# Patient Record
Sex: Female | Born: 1945 | Race: White | Hispanic: No | Smoking: Never smoker
Health system: Southern US, Community
[De-identification: ages and names within clinical notes are randomized; demographics above are authoritative.]

## PROBLEM LIST (undated history)

## (undated) DIAGNOSIS — D039 Melanoma in situ, unspecified: Secondary | ICD-10-CM

## (undated) DIAGNOSIS — D229 Melanocytic nevi, unspecified: Secondary | ICD-10-CM

## (undated) HISTORY — DX: Melanoma in situ, unspecified: D03.9

## (undated) HISTORY — DX: Melanocytic nevi, unspecified: D22.9

---
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
## 1993-05-20 HISTORY — PX: ABDOMINAL HYSTERECTOMY: SHX81

## 1999-03-21 ENCOUNTER — Other Ambulatory Visit: Admission: RE | Admit: 1999-03-21 | Discharge: 1999-03-21 | Payer: Self-pay | Admitting: *Deleted

## 2000-04-07 ENCOUNTER — Other Ambulatory Visit: Admission: RE | Admit: 2000-04-07 | Discharge: 2000-04-07 | Payer: Self-pay | Admitting: *Deleted

## 2001-04-07 ENCOUNTER — Other Ambulatory Visit: Admission: RE | Admit: 2001-04-07 | Discharge: 2001-04-07 | Payer: Self-pay | Admitting: *Deleted

## 2002-04-12 DIAGNOSIS — D039 Melanoma in situ, unspecified: Secondary | ICD-10-CM

## 2002-04-12 HISTORY — DX: Melanoma in situ, unspecified: D03.9

## 2004-10-12 ENCOUNTER — Ambulatory Visit (HOSPITAL_COMMUNITY): Admission: RE | Admit: 2004-10-12 | Discharge: 2004-10-12 | Payer: Self-pay | Admitting: Obstetrics and Gynecology

## 2005-04-22 DIAGNOSIS — D229 Melanocytic nevi, unspecified: Secondary | ICD-10-CM

## 2005-04-22 HISTORY — DX: Melanocytic nevi, unspecified: D22.9

## 2005-05-20 HISTORY — PX: COLONOSCOPY: SHX174

## 2005-12-26 ENCOUNTER — Ambulatory Visit (HOSPITAL_COMMUNITY): Admission: RE | Admit: 2005-12-26 | Discharge: 2005-12-26 | Payer: Self-pay | Admitting: Family Medicine

## 2007-01-14 ENCOUNTER — Ambulatory Visit (HOSPITAL_COMMUNITY): Admission: RE | Admit: 2007-01-14 | Discharge: 2007-01-14 | Payer: Self-pay | Admitting: Family Medicine

## 2008-02-10 ENCOUNTER — Ambulatory Visit (HOSPITAL_COMMUNITY): Admission: RE | Admit: 2008-02-10 | Discharge: 2008-02-10 | Payer: Self-pay | Admitting: Family Medicine

## 2009-02-14 ENCOUNTER — Ambulatory Visit (HOSPITAL_COMMUNITY): Admission: RE | Admit: 2009-02-14 | Discharge: 2009-02-14 | Payer: Self-pay | Admitting: Family Medicine

## 2010-03-30 ENCOUNTER — Ambulatory Visit (HOSPITAL_COMMUNITY): Admission: RE | Admit: 2010-03-30 | Discharge: 2010-03-30 | Payer: Self-pay | Admitting: Family Medicine

## 2011-03-13 ENCOUNTER — Other Ambulatory Visit (HOSPITAL_COMMUNITY): Payer: Self-pay | Admitting: Family Medicine

## 2011-03-13 DIAGNOSIS — Z1231 Encounter for screening mammogram for malignant neoplasm of breast: Secondary | ICD-10-CM

## 2011-04-02 ENCOUNTER — Ambulatory Visit (HOSPITAL_COMMUNITY)
Admission: RE | Admit: 2011-04-02 | Discharge: 2011-04-02 | Disposition: A | Discharge status: Home / Self Care | Payer: Medicare Other | Source: Ambulatory Visit | Attending: Family Medicine | Admitting: Family Medicine

## 2011-04-02 DIAGNOSIS — Z1231 Encounter for screening mammogram for malignant neoplasm of breast: Secondary | ICD-10-CM | POA: Insufficient documentation

## 2012-02-28 ENCOUNTER — Other Ambulatory Visit (HOSPITAL_COMMUNITY): Payer: Self-pay | Admitting: Family Medicine

## 2012-02-28 DIAGNOSIS — Z1231 Encounter for screening mammogram for malignant neoplasm of breast: Secondary | ICD-10-CM

## 2012-04-06 ENCOUNTER — Ambulatory Visit (HOSPITAL_COMMUNITY)
Admission: RE | Admit: 2012-04-06 | Discharge: 2012-04-06 | Disposition: A | Discharge status: Home / Self Care | Payer: Medicare Other | Source: Ambulatory Visit | Attending: Family Medicine | Admitting: Family Medicine

## 2012-04-06 DIAGNOSIS — Z1231 Encounter for screening mammogram for malignant neoplasm of breast: Secondary | ICD-10-CM | POA: Insufficient documentation

## 2013-03-18 ENCOUNTER — Other Ambulatory Visit (HOSPITAL_COMMUNITY): Payer: Self-pay | Admitting: Family Medicine

## 2013-03-18 DIAGNOSIS — Z1231 Encounter for screening mammogram for malignant neoplasm of breast: Secondary | ICD-10-CM

## 2013-04-08 ENCOUNTER — Ambulatory Visit (HOSPITAL_COMMUNITY)
Admission: RE | Admit: 2013-04-08 | Discharge: 2013-04-08 | Disposition: A | Discharge status: Home / Self Care | Payer: Medicare Other | Source: Ambulatory Visit | Attending: Family Medicine | Admitting: Family Medicine

## 2013-04-08 DIAGNOSIS — Z1231 Encounter for screening mammogram for malignant neoplasm of breast: Secondary | ICD-10-CM | POA: Insufficient documentation

## 2013-11-16 ENCOUNTER — Other Ambulatory Visit: Payer: Self-pay | Admitting: Orthopedic Surgery

## 2013-11-16 ENCOUNTER — Encounter (HOSPITAL_BASED_OUTPATIENT_CLINIC_OR_DEPARTMENT_OTHER): Payer: Self-pay | Admitting: Cardiology

## 2013-11-16 NOTE — Progress Notes (Signed)
No labs needed

## 2013-11-22 NOTE — H&P (Signed)
  [name] [name] is an 68 y.o. female.   Chief Complaint: c/o chronic and progressive STS right thumb HPI: [name] [name] presented for evaluation of stiffness of her right thumb IP joint and stenosing tenosynovitis with tenderness over the A-1 pulley. 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 Her symptoms have been present since the first week of December. She has noted swelling of her IP joint after walking her dog.    History reviewed. No pertinent past medical history.  Past Surgical History  Procedure Laterality Date  . Appendectomy  1954  . Abdominal hysterectomy  1995  . Wisdom tooth extraction  1964  . Colonoscopy  2007    History reviewed. No pertinent family history. Social History:  reports that she has never smoked. She does not have any smokeless tobacco history on file. She reports that she drinks alcohol. She reports that she does not use illicit drugs.  Allergies: No Known Allergies  No prescriptions prior to admission    No results found for this or any previous visit (from the past 48 hour(s)).  No results found.   Pertinent items are noted in HPI.  Height 5' 3.5" (1.613 m), weight 62.143 kg (137 lb).  General appearance: alert Head: Normocephalic, without obvious abnormality Neck: supple, symmetrical, trachea midline Resp: clear to auscultation bilaterally Cardio: regular rate and rhythm GI: normal findings: bowel sounds normal Extremities:  Inspection of her hand reveals swelling of her right thumb IP joint with loss of full ROM. She triggers over the A-1 pulley. She cannot fully extend the IP joint when compared to the contralateral side. She has full ROM of her fingers. Her motor and sensory exam is intact. Her pulses and capillary refill is intact.  Four views of the right thumb demonstrate age appropriate changes but no significant pathology.   Pulses: 2+ and symmetric Skin: normal Neurologic: Grossly normal    Assessment/Plan Impression: Chronic  STS right thumb  Plan: To the OR for release A-1 pulley right thumb.The procedure, risks,benefits and post-op course were discussed with the patient at length and they were in agreement with the plan.  DASNOIT,[name] [name] 11/22/2013, 3:47 PM   H&P documentation: 11/23/2013  -History and Physical Reviewed  -Patient has been re-examined  -No change in the plan of care  [name] Sickle, MD

## 2013-11-23 ENCOUNTER — Encounter (HOSPITAL_BASED_OUTPATIENT_CLINIC_OR_DEPARTMENT_OTHER)
Admission: RE | Disposition: A | Discharge status: Home / Self Care | Payer: Self-pay | Source: Ambulatory Visit | Attending: Orthopedic Surgery

## 2013-11-23 ENCOUNTER — Ambulatory Visit (HOSPITAL_BASED_OUTPATIENT_CLINIC_OR_DEPARTMENT_OTHER): Payer: Medicare Other | Admitting: Anesthesiology

## 2013-11-23 ENCOUNTER — Ambulatory Visit (HOSPITAL_BASED_OUTPATIENT_CLINIC_OR_DEPARTMENT_OTHER)
Admission: RE | Admit: 2013-11-23 | Discharge: 2013-11-23 | Disposition: A | Discharge status: Home / Self Care | Payer: Medicare Other | Source: Ambulatory Visit | Attending: Orthopedic Surgery | Admitting: Orthopedic Surgery

## 2013-11-23 ENCOUNTER — Encounter (HOSPITAL_BASED_OUTPATIENT_CLINIC_OR_DEPARTMENT_OTHER): Payer: Self-pay | Admitting: Anesthesiology

## 2013-11-23 ENCOUNTER — Encounter (HOSPITAL_BASED_OUTPATIENT_CLINIC_OR_DEPARTMENT_OTHER): Payer: Medicare Other | Admitting: Anesthesiology

## 2013-11-23 DIAGNOSIS — M6530 Trigger finger, unspecified finger: Secondary | ICD-10-CM | POA: Insufficient documentation

## 2013-11-23 DIAGNOSIS — M65839 Other synovitis and tenosynovitis, unspecified forearm: Secondary | ICD-10-CM | POA: Insufficient documentation

## 2013-11-23 DIAGNOSIS — M65849 Other synovitis and tenosynovitis, unspecified hand: Principal | ICD-10-CM

## 2013-11-23 HISTORY — PX: TRIGGER FINGER RELEASE: SHX641

## 2013-11-23 SURGERY — RELEASE, A1 PULLEY, FOR TRIGGER FINGER
Anesthesia: Monitor Anesthesia Care | Site: Thumb | Laterality: Right

## 2013-11-23 MED ORDER — PROPOFOL INFUSION 10 MG/ML OPTIME
INTRAVENOUS | Status: DC | PRN
  Administered 2013-11-23: 100 ug/kg/min via INTRAVENOUS

## 2013-11-23 MED ORDER — OXYCODONE HCL 5 MG PO TABS: 5.0000 mg | ORAL_TABLET | Freq: Once | ORAL | Status: DC | PRN

## 2013-11-23 MED ORDER — MIDAZOLAM HCL 5 MG/5ML IJ SOLN
INTRAMUSCULAR | Status: DC | PRN
  Administered 2013-11-23: 2 mg via INTRAVENOUS

## 2013-11-23 MED ORDER — LIDOCAINE HCL 2 % IJ SOLN
INTRAMUSCULAR | Status: DC | PRN
  Administered 2013-11-23: 2 mL

## 2013-11-23 MED ORDER — ACETAMINOPHEN-CODEINE #3 300-30 MG PO TABS: 1.0000 | ORAL_TABLET | ORAL | Status: DC | PRN

## 2013-11-23 MED ORDER — ONDANSETRON HCL 4 MG/2ML IJ SOLN: 4.0000 mg | Freq: Once | INTRAMUSCULAR | Status: DC | PRN

## 2013-11-23 MED ORDER — PROPOFOL 10 MG/ML IV BOLUS
INTRAVENOUS | Status: DC | PRN
  Administered 2013-11-23: 50 mg via INTRAVENOUS

## 2013-11-23 MED ORDER — FENTANYL CITRATE 0.05 MG/ML IJ SOLN: 50.0000 ug | INTRAMUSCULAR | Status: DC | PRN

## 2013-11-23 MED ORDER — MIDAZOLAM HCL 2 MG/2ML IJ SOLN: 1.0000 mg | INTRAMUSCULAR | Status: DC | PRN

## 2013-11-23 MED ORDER — OXYCODONE HCL 5 MG/5ML PO SOLN: 5.0000 mg | Freq: Once | ORAL | Status: DC | PRN

## 2013-11-23 MED ORDER — HYDROMORPHONE HCL PF 1 MG/ML IJ SOLN: 0.2500 mg | INTRAMUSCULAR | Status: DC | PRN

## 2013-11-23 MED ORDER — CHLORHEXIDINE GLUCONATE 4 % EX LIQD: 60.0000 mL | Freq: Once | CUTANEOUS | Status: DC

## 2013-11-23 MED ORDER — MIDAZOLAM HCL 2 MG/2ML IJ SOLN
INTRAMUSCULAR | Status: AC
  Filled 2013-11-23: qty 2

## 2013-11-23 MED ORDER — LACTATED RINGERS IV SOLN
INTRAVENOUS | Status: DC
  Administered 2013-11-23: 09:00:00 via INTRAVENOUS

## 2013-11-23 MED ORDER — FENTANYL CITRATE 0.05 MG/ML IJ SOLN
INTRAMUSCULAR | Status: AC
Start: 2013-11-23 — End: 2013-11-23
  Filled 2013-11-23: qty 2

## 2013-11-23 MED ORDER — LIDOCAINE HCL 2 % IJ SOLN
INTRAMUSCULAR | Status: AC
  Filled 2013-11-23: qty 20

## 2013-11-23 MED ORDER — FENTANYL CITRATE 0.05 MG/ML IJ SOLN
INTRAMUSCULAR | Status: DC | PRN
  Administered 2013-11-23: 100 ug via INTRAVENOUS

## 2013-11-23 SURGICAL SUPPLY — 37 items
BANDAGE ADH SHEER 1  50/CT (GAUZE/BANDAGES/DRESSINGS) IMPLANT
BANDAGE COBAN STERILE 2 (GAUZE/BANDAGES/DRESSINGS) ×2 IMPLANT
BLADE SURG 15 STRL LF DISP TIS (BLADE) ×1 IMPLANT
BLADE SURG 15 STRL SS (BLADE) ×2
BNDG CMPR 9X4 STRL LF SNTH (GAUZE/BANDAGES/DRESSINGS) ×1
BNDG ESMARK 4X9 LF (GAUZE/BANDAGES/DRESSINGS) ×2 IMPLANT
BRUSH SCRUB EZ PLAIN DRY (MISCELLANEOUS) ×2 IMPLANT
CORDS BIPOLAR (ELECTRODE) ×1 IMPLANT
COVER MAYO STAND STRL (DRAPES) ×2 IMPLANT
COVER TABLE BACK 60X90 (DRAPES) ×2 IMPLANT
CUFF TOURNIQUET SINGLE 18IN (TOURNIQUET CUFF) ×1 IMPLANT
DECANTER SPIKE VIAL GLASS SM (MISCELLANEOUS) ×1 IMPLANT
DRAPE EXTREMITY T 121X128X90 (DRAPE) ×2 IMPLANT
DRAPE SURG 17X23 STRL (DRAPES) ×2 IMPLANT
GAUZE SPONGE 4X4 12PLY STRL (GAUZE/BANDAGES/DRESSINGS) ×2 IMPLANT
GLOVE BIOGEL M STRL SZ7.5 (GLOVE) ×2 IMPLANT
GLOVE ORTHO TXT STRL SZ7.5 (GLOVE) ×2 IMPLANT
GOWN STRL REUS W/ TWL LRG LVL3 (GOWN DISPOSABLE) ×1 IMPLANT
GOWN STRL REUS W/ TWL XL LVL3 (GOWN DISPOSABLE) ×2 IMPLANT
GOWN STRL REUS W/TWL LRG LVL3 (GOWN DISPOSABLE) ×2
GOWN STRL REUS W/TWL XL LVL3 (GOWN DISPOSABLE) ×2
NEEDLE 27GAX1X1/2 (NEEDLE) ×2 IMPLANT
PACK BASIN DAY SURGERY FS (CUSTOM PROCEDURE TRAY) ×2 IMPLANT
PADDING CAST ABS 4INX4YD NS (CAST SUPPLIES) ×1
PADDING CAST ABS COTTON 4X4 ST (CAST SUPPLIES) ×1 IMPLANT
STOCKINETTE 4X48 STRL (DRAPES) ×2 IMPLANT
STRIP CLOSURE SKIN 1/2X4 (GAUZE/BANDAGES/DRESSINGS) ×1 IMPLANT
SUT ETHILON 5 0 P 3 18 (SUTURE)
SUT NYLON ETHILON 5-0 P-3 1X18 (SUTURE) IMPLANT
SUT PROLENE 3 0 PS 2 (SUTURE) IMPLANT
SUT PROLENE 4 0 P 3 18 (SUTURE) ×1 IMPLANT
SYR 3ML 23GX1 SAFETY (SYRINGE) IMPLANT
SYRINGE CONTROL L 12CC (SYRINGE) ×2 IMPLANT
SYRINGE CONTROL LL 12CC (SYRINGE) IMPLANT
TOWEL OR 17X24 6PK STRL BLUE (TOWEL DISPOSABLE) ×2 IMPLANT
TRAY DSU PREP LF (CUSTOM PROCEDURE TRAY) ×2 IMPLANT
UNDERPAD 30X30 INCONTINENT (UNDERPADS AND DIAPERS) ×2 IMPLANT

## 2013-11-23 NOTE — Brief Op Note (Signed)
11/23/2013  9:20 AM  PATIENT:  [name] [name]  68 y.o. female  PRE-OPERATIVE DIAGNOSIS:  CHRONIC STENOSING TENOSYNOVITIS RIGHT THUMB  POST-OPERATIVE DIAGNOSIS:  chronic stenosing tenosynovitis right thumb  PROCEDURE:  Procedure(s): RELEASE RIGHT THUMB A-1 PULLEY (Right)  SURGEON:  Surgeon(s) and Role:    * Cammie Sickle, MD - Primary  PHYSICIAN ASSISTANT:   ASSISTANTS: surgical tech  ANESTHESIA:   IV sedation  EBL:     BLOOD ADMINISTERED:none  DRAINS: none   LOCAL MEDICATIONS USED:  XYLOCAINE   SPECIMEN:  No Specimen  DISPOSITION OF SPECIMEN:  N/A  COUNTS:  YES  TOURNIQUET:   Total Tourniquet Time Documented: Upper Arm (Right) - 10 minutes Total: Upper Arm (Right) - 10 minutes   DICTATION: .Other Dictation: Dictation Number [id number]  PLAN OF CARE: Discharge to home after PACU  PATIENT DISPOSITION:  PACU - hemodynamically stable.   Delay start of Pharmacological VTE agent (>24hrs) due to surgical blood loss or risk of bleeding: not applicable

## 2013-11-23 NOTE — Anesthesia Postprocedure Evaluation (Signed)
  Anesthesia Post-op Note  Patient: [name] [name]  Procedure(s) Performed: Procedure(s): RELEASE RIGHT THUMB A-1 PULLEY (Right)  Patient Location: PACU  Anesthesia Type:MAC  Level of Consciousness: awake, alert  and oriented  Airway and Oxygen Therapy: Patient Spontanous Breathing  Post-op Pain: none  Post-op Assessment: Post-op Vital signs reviewed  Post-op Vital Signs: Reviewed  Last Vitals:  Filed Vitals:   11/23/13 0945  BP: 101/51  Pulse: 63  Temp:   Resp: 13    Complications: No apparent anesthesia complications

## 2013-11-23 NOTE — Transfer of Care (Signed)
Immediate Anesthesia Transfer of Care Note  Patient: [name] [name]  Procedure(s) Performed: Procedure(s): RELEASE RIGHT THUMB A-1 PULLEY (Right)  Patient Location: PACU  Anesthesia Type:MAC  Level of Consciousness: awake, alert  and oriented  Airway & Oxygen Therapy: Patient Spontanous Breathing  Post-op Assessment: Report given to PACU RN and Post -op Vital signs reviewed and stable  Post vital signs: Reviewed and stable  Complications: No apparent anesthesia complications

## 2013-11-23 NOTE — Op Note (Signed)
[id number] 

## 2013-11-23 NOTE — Discharge Instructions (Addendum)

## 2013-11-23 NOTE — Anesthesia Preprocedure Evaluation (Signed)
Anesthesia Evaluation  Patient identified by MRN, date of birth, ID band Patient awake    Reviewed: Allergy & Precautions, H&P , NPO status , Patient's Chart, lab work & pertinent test results  Airway Mallampati: I TM Distance: >3 FB Neck ROM: Full    Dental  (+) Teeth Intact, Dental Advisory Given   Pulmonary  breath sounds clear to auscultation        Cardiovascular Rhythm:Regular Rate:Normal     Neuro/Psych    GI/Hepatic   Endo/Other    Renal/GU      Musculoskeletal   Abdominal   Peds  Hematology   Anesthesia Other Findings   Reproductive/Obstetrics                           Anesthesia Physical Anesthesia Plan  ASA: I  Anesthesia Plan: MAC   Post-op Pain Management:    Induction: Intravenous  Airway Management Planned: Simple Face Mask  Additional Equipment:   Intra-op Plan:   Post-operative Plan:   Informed Consent: I have reviewed the patients History and Physical, chart, labs and discussed the procedure including the risks, benefits and alternatives for the proposed anesthesia with the patient or authorized representative who has indicated his/her understanding and acceptance.   Dental advisory given  Plan Discussed with: CRNA, Anesthesiologist and Surgeon  Anesthesia Plan Comments:         Anesthesia Quick Evaluation

## 2013-11-24 ENCOUNTER — Encounter (HOSPITAL_BASED_OUTPATIENT_CLINIC_OR_DEPARTMENT_OTHER): Payer: Self-pay | Admitting: Orthopedic Surgery

## 2013-11-24 NOTE — Addendum Note (Signed)
Addendum created 11/24/13 7672 by Tawni Millers, CRNA   Modules edited: Charges VN

## 2013-11-24 NOTE — Op Note (Signed)
NAME:  [name] [name]               ACCOUNT NO.:  [id number]  MEDICAL RECORD NO.:  [id number]  LOCATION:                                 FACILITY:  PHYSICIAN:  [name] Mighty. Yitzel Shasteen, M.D.      DATE OF BIRTH:  DATE OF PROCEDURE:  11/23/2013 DATE OF DISCHARGE:                              OPERATIVE REPORT   PREOPERATIVE DIAGNOSIS:  Chronic stenosing tenosynovitis of right thumb at A1 pulley.  POSTOPERATIVE DIAGNOSIS:  Chronic stenosing tenosynovitis of right thumb at A1 pulley.  OPERATIONS:  Release of right thumb A1 pulley.  OPERATING SURGEON:  [name] Mighty. Lillard Bailon, M.D.  ASSISTANT:  Surgical technician.  ANESTHESIA:  2% lidocaine field block and flexor sheath block, right thumb, supplemented by IV sedation.  SUPERVISING ANESTHESIOLOGIST:  Lorrene Reid, M.D.  INDICATIONS:  [name] [name] is a 68 year old woman referred through the courtesy of Dr. Kathyrn Lass for evaluation and management of a locking right trigger thumb.  She initially began treatment in February 2015, she responded initially to steroid injection, followed by period of therapeutic taping.  She developed a recurrent stenosing tenosynovitis with a locked right thumb IP joint.  We recommended proceeding with release of the A1 pulley under local anesthesia and sedation.  After informed consent, she is brought to the operating room at this time.  PROCEDURE:  [name] [name] is brought to room #2 of the [facility] and placed in supine position on the operating table.  In the holding area, her right thumb is marked as the proper surgical site per protocol with a marking pen.  Dr. Al Corpus provided detailed anesthesia informed consent.  Local anesthesia with sedation was recommended and accepted by [name] [name].  Preoperatively, she had been reminded of potential risks and benefits of surgery.  Questions were invited and answered in detail.  PROCEDURE IN DETAILS:  [name] [name] was brought to room #2 of the  [facility] and placed in supine position on the operating table. Following IV sedation under Dr. Al Corpus supervision, the right thumb and palm were prepped with Betadine followed by infiltration of 2 mL of 2% lidocaine into the path of the intended incision and flexor sheath of the right thumb.  The right hand and arm were then prepped with Betadine soap and solution and sterilely draped.  A pneumatic tourniquet was applied to the proximal right brachium.  Following exsanguination of the right arm with Esmarch bandage, arterial tourniquet inflated to 220 mmHg.  Procedure commenced with routine surgical time-out.  Thereafter, a short transverse incision was fashioned directly over the palpably thickened A1 pulley.  Subcutaneous tissues were carefully divided.  Tenotomy scissors were used to expose the A1 pulley, followed by careful retraction of the radial proper digital nerve.  Three Ragnell retractors were placed Mercedes style clearly visualizing the A1 pulley followed by release of the pulley with scalpel and scissors.  Closed tenotomy scissors were passed proximally and distally to be sure there were no other secondary sites of entrapment.  None were there.  The IP joint recovered full passive range of motion.  We noted a very significant swelling on the flexor pollicis longus tendon due to chronic entrapment at  the A1 pulley.  This should resolve over the next few weeks.  The wound was then repaired with intradermal 4-0 Prolene suture and Steri-Strips.  [name] [name] was placed in compressive dressing with Steri- Strips, sterile gauze, and Coban.  For aftercare, she is provided a prescription for Tylenol with codeine No.3 one tab p.o. q.4-6 hours p.r.n. pain, 10 tablets without refill. We have encouraged to use Tylenol and ibuprofen as a primary analgesic.     [name] [name], M.D.     RVS/MEDQ  D:  11/23/2013  T:  11/24/2013  Job:  [id number]  cc:   Kathyrn Lass,  M.D.

## 2014-04-21 ENCOUNTER — Other Ambulatory Visit (HOSPITAL_COMMUNITY): Payer: Self-pay | Admitting: Family Medicine

## 2014-04-21 DIAGNOSIS — Z1231 Encounter for screening mammogram for malignant neoplasm of breast: Secondary | ICD-10-CM

## 2014-04-29 ENCOUNTER — Ambulatory Visit (HOSPITAL_COMMUNITY)
Admission: RE | Admit: 2014-04-29 | Discharge: 2014-04-29 | Disposition: A | Discharge status: Home / Self Care | Payer: Medicare Other | Source: Ambulatory Visit | Attending: Family Medicine | Admitting: Family Medicine

## 2014-04-29 DIAGNOSIS — Z1231 Encounter for screening mammogram for malignant neoplasm of breast: Secondary | ICD-10-CM

## 2015-04-06 ENCOUNTER — Other Ambulatory Visit: Payer: Self-pay

## 2015-04-06 DIAGNOSIS — Z1231 Encounter for screening mammogram for malignant neoplasm of breast: Secondary | ICD-10-CM

## 2015-05-10 ENCOUNTER — Ambulatory Visit
Admission: RE | Admit: 2015-05-10 | Discharge: 2015-05-10 | Disposition: A | Discharge status: Home / Self Care | Payer: Medicare Other | Source: Ambulatory Visit

## 2015-05-10 DIAGNOSIS — Z1231 Encounter for screening mammogram for malignant neoplasm of breast: Secondary | ICD-10-CM

## 2016-04-17 ENCOUNTER — Other Ambulatory Visit: Payer: Self-pay | Admitting: Family Medicine

## 2016-04-17 DIAGNOSIS — Z1231 Encounter for screening mammogram for malignant neoplasm of breast: Secondary | ICD-10-CM

## 2016-05-22 ENCOUNTER — Ambulatory Visit
Admission: RE | Admit: 2016-05-22 | Discharge: 2016-05-22 | Disposition: A | Discharge status: Home / Self Care | Payer: Medicare Other | Source: Ambulatory Visit | Attending: Family Medicine | Admitting: Family Medicine

## 2016-05-22 DIAGNOSIS — Z1231 Encounter for screening mammogram for malignant neoplasm of breast: Secondary | ICD-10-CM

## 2018-06-30 ENCOUNTER — Other Ambulatory Visit: Payer: Self-pay | Admitting: Family Medicine

## 2018-06-30 DIAGNOSIS — E7800 Pure hypercholesterolemia, unspecified: Secondary | ICD-10-CM

## 2018-07-01 ENCOUNTER — Other Ambulatory Visit: Payer: Self-pay | Admitting: Family Medicine

## 2018-07-01 DIAGNOSIS — Z1231 Encounter for screening mammogram for malignant neoplasm of breast: Secondary | ICD-10-CM

## 2018-07-14 ENCOUNTER — Ambulatory Visit (HOSPITAL_COMMUNITY)
Admission: RE | Admit: 2018-07-14 | Discharge: 2018-07-14 | Disposition: A | Discharge status: Home / Self Care | Payer: Medicare Other | Source: Ambulatory Visit | Attending: Family Medicine | Admitting: Family Medicine

## 2018-07-14 DIAGNOSIS — R918 Other nonspecific abnormal finding of lung field: Secondary | ICD-10-CM | POA: Diagnosis not present

## 2018-07-14 DIAGNOSIS — I712 Thoracic aortic aneurysm, without rupture: Secondary | ICD-10-CM | POA: Diagnosis not present

## 2018-07-14 DIAGNOSIS — I313 Pericardial effusion (noninflammatory): Secondary | ICD-10-CM | POA: Insufficient documentation

## 2018-07-14 DIAGNOSIS — E7800 Pure hypercholesterolemia, unspecified: Secondary | ICD-10-CM | POA: Diagnosis not present

## 2018-07-27 ENCOUNTER — Ambulatory Visit
Admission: RE | Admit: 2018-07-27 | Discharge: 2018-07-27 | Disposition: A | Discharge status: Home / Self Care | Payer: Medicare Other | Source: Ambulatory Visit | Attending: Family Medicine | Admitting: Family Medicine

## 2018-07-27 DIAGNOSIS — Z1231 Encounter for screening mammogram for malignant neoplasm of breast: Secondary | ICD-10-CM

## 2018-08-13 ENCOUNTER — Encounter: Payer: Self-pay | Admitting: *Deleted

## 2019-06-08 ENCOUNTER — Ambulatory Visit: Payer: Medicare Other | Attending: Internal Medicine

## 2019-06-08 DIAGNOSIS — Z23 Encounter for immunization: Secondary | ICD-10-CM

## 2019-06-08 NOTE — Progress Notes (Signed)
   Covid-19 Vaccination Clinic  Name:  MICKELA HOLDERBAUM    MRN: [id number] DOB: May 18, 1946  06/08/2019  Ms. Folk was observed post Covid-19 immunization for 15 minutes without incidence. She was provided with Vaccine Information Sheet and instruction to access the V-Safe system.   Ms. Giffen was instructed to call 911 with any severe reactions post vaccine: Marland Kitchen Difficulty breathing  . Swelling of your face and throat  . A fast heartbeat  . A bad rash all over your body  . Dizziness and weakness    Immunizations Administered    Name Date Dose VIS Date Route   Pfizer COVID-19 Vaccine 06/08/2019 12:45 PM 0.3 mL 04/30/2019 Intramuscular   Manufacturer: [place]   Lot: [id number]   [place]: [id number]

## 2019-06-21 ENCOUNTER — Ambulatory Visit: Payer: Self-pay

## 2019-06-28 ENCOUNTER — Ambulatory Visit: Payer: Medicare Other | Attending: Internal Medicine

## 2019-06-28 DIAGNOSIS — Z23 Encounter for immunization: Secondary | ICD-10-CM | POA: Insufficient documentation

## 2019-06-28 NOTE — Progress Notes (Signed)
   Covid-19 Vaccination Clinic  Name:  [name] [name]    MRN: [id number] DOB: 30-Jun-1945  06/28/2019  [name] was observed post Covid-19 immunization for 15 minutes without incidence. She was provided with Vaccine Information Sheet and instruction to access the V-Safe system.   [name] was instructed to call 911 with any severe reactions post vaccine: Marland Kitchen Difficulty breathing  . Swelling of your face and throat  . A fast heartbeat  . A bad rash all over your body  . Dizziness and weakness    Immunizations Administered    Name Date Dose VIS Date Route   Pfizer COVID-19 Vaccine 06/28/2019  8:44 AM 0.3 mL 04/30/2019 Intramuscular   Manufacturer: [place]   Lot: [id number]   [place]: [id number]

## 2019-07-13 ENCOUNTER — Other Ambulatory Visit: Payer: Self-pay | Admitting: Family Medicine

## 2019-07-20 ENCOUNTER — Other Ambulatory Visit: Payer: Self-pay | Admitting: Family Medicine

## 2019-07-20 DIAGNOSIS — Z1231 Encounter for screening mammogram for malignant neoplasm of breast: Secondary | ICD-10-CM

## 2019-07-27 ENCOUNTER — Other Ambulatory Visit: Payer: Self-pay | Admitting: Family Medicine

## 2019-07-27 DIAGNOSIS — I7121 Aneurysm of the ascending aorta, without rupture: Secondary | ICD-10-CM

## 2019-07-27 DIAGNOSIS — I712 Thoracic aortic aneurysm, without rupture: Secondary | ICD-10-CM

## 2019-08-11 ENCOUNTER — Other Ambulatory Visit: Payer: Self-pay

## 2019-08-11 ENCOUNTER — Ambulatory Visit
Admission: RE | Admit: 2019-08-11 | Discharge: 2019-08-11 | Disposition: A | Discharge status: Home / Self Care | Payer: Medicare Other | Source: Ambulatory Visit | Attending: Family Medicine | Admitting: Family Medicine

## 2019-08-11 DIAGNOSIS — Z1231 Encounter for screening mammogram for malignant neoplasm of breast: Secondary | ICD-10-CM

## 2019-08-16 ENCOUNTER — Ambulatory Visit: Payer: Medicare Other | Attending: Internal Medicine

## 2019-08-16 ENCOUNTER — Ambulatory Visit
Admission: RE | Admit: 2019-08-16 | Discharge: 2019-08-16 | Disposition: A | Discharge status: Home / Self Care | Payer: Medicare Other | Source: Ambulatory Visit | Attending: Family Medicine | Admitting: Family Medicine

## 2019-08-16 DIAGNOSIS — I712 Thoracic aortic aneurysm, without rupture: Secondary | ICD-10-CM

## 2019-08-16 DIAGNOSIS — I7121 Aneurysm of the ascending aorta, without rupture: Secondary | ICD-10-CM

## 2019-08-16 DIAGNOSIS — Z20822 Contact with and (suspected) exposure to covid-19: Secondary | ICD-10-CM

## 2019-08-16 MED ORDER — GADOBENATE DIMEGLUMINE 529 MG/ML IV SOLN
12.0000 mL | Freq: Once | INTRAVENOUS | Status: AC | PRN
  Administered 2019-08-16: 14:00:00 12 mL via INTRAVENOUS

## 2019-08-17 LAB — SARS-COV-2, NAA 2 DAY TAT

## 2019-08-17 LAB — NOVEL CORONAVIRUS, NAA: SARS-CoV-2, NAA: NOT DETECTED

## 2019-08-27 ENCOUNTER — Other Ambulatory Visit: Payer: Self-pay | Admitting: Family Medicine

## 2019-08-27 DIAGNOSIS — R911 Solitary pulmonary nodule: Secondary | ICD-10-CM

## 2019-11-08 ENCOUNTER — Other Ambulatory Visit: Payer: Self-pay

## 2019-11-11 ENCOUNTER — Other Ambulatory Visit: Payer: Self-pay

## 2019-11-11 MED ORDER — VALGANCICLOVIR HCL 450 MG PO TABS: 450.0000 mg | ORAL_TABLET | Freq: Every day | ORAL | 1 refills | Status: AC

## 2019-11-12 ENCOUNTER — Telehealth: Payer: Self-pay | Admitting: *Deleted

## 2019-11-12 NOTE — Telephone Encounter (Signed)
Phone call to the [facility] to clarify the prescription for valacyclovir. The confirmed that it had been changed from valgancyclovior 450 to the original prescription valacyclovir 1 gram.

## 2020-04-21 ENCOUNTER — Ambulatory Visit
Admission: RE | Admit: 2020-04-21 | Discharge: 2020-04-21 | Disposition: A | Discharge status: Home / Self Care | Payer: Medicare Other | Source: Ambulatory Visit | Attending: Family Medicine | Admitting: Family Medicine

## 2020-04-21 ENCOUNTER — Other Ambulatory Visit: Payer: Self-pay

## 2020-04-21 DIAGNOSIS — R911 Solitary pulmonary nodule: Secondary | ICD-10-CM

## 2020-06-30 DIAGNOSIS — Z79899 Other long term (current) drug therapy: Secondary | ICD-10-CM | POA: Diagnosis not present

## 2020-06-30 DIAGNOSIS — E559 Vitamin D deficiency, unspecified: Secondary | ICD-10-CM | POA: Diagnosis not present

## 2020-07-13 DIAGNOSIS — Z0000 Encounter for general adult medical examination without abnormal findings: Secondary | ICD-10-CM | POA: Diagnosis not present

## 2020-07-13 DIAGNOSIS — H401190 Primary open-angle glaucoma, unspecified eye, stage unspecified: Secondary | ICD-10-CM | POA: Diagnosis not present

## 2020-07-13 DIAGNOSIS — M859 Disorder of bone density and structure, unspecified: Secondary | ICD-10-CM | POA: Diagnosis not present

## 2020-07-13 DIAGNOSIS — E559 Vitamin D deficiency, unspecified: Secondary | ICD-10-CM | POA: Diagnosis not present

## 2020-07-14 ENCOUNTER — Other Ambulatory Visit: Payer: Self-pay | Admitting: Family Medicine

## 2020-07-14 DIAGNOSIS — M8580 Other specified disorders of bone density and structure, unspecified site: Secondary | ICD-10-CM

## 2020-07-14 DIAGNOSIS — Z1231 Encounter for screening mammogram for malignant neoplasm of breast: Secondary | ICD-10-CM

## 2020-09-13 DIAGNOSIS — H401132 Primary open-angle glaucoma, bilateral, moderate stage: Secondary | ICD-10-CM | POA: Diagnosis not present

## 2020-09-14 ENCOUNTER — Ambulatory Visit: Payer: Medicare Other

## 2020-10-12 ENCOUNTER — Other Ambulatory Visit: Payer: Self-pay

## 2020-10-12 ENCOUNTER — Ambulatory Visit
Admission: RE | Admit: 2020-10-12 | Discharge: 2020-10-12 | Disposition: A | Discharge status: Home / Self Care | Payer: Medicare Other | Source: Ambulatory Visit | Attending: Family Medicine | Admitting: Family Medicine

## 2020-10-12 DIAGNOSIS — Z1231 Encounter for screening mammogram for malignant neoplasm of breast: Secondary | ICD-10-CM | POA: Diagnosis not present

## 2020-12-22 DIAGNOSIS — Z4802 Encounter for removal of sutures: Secondary | ICD-10-CM | POA: Diagnosis not present

## 2020-12-29 ENCOUNTER — Ambulatory Visit
Admission: RE | Admit: 2020-12-29 | Discharge: 2020-12-29 | Disposition: A | Discharge status: Home / Self Care | Payer: Medicare Other | Source: Ambulatory Visit | Attending: Family Medicine | Admitting: Family Medicine

## 2020-12-29 ENCOUNTER — Other Ambulatory Visit: Payer: Self-pay

## 2020-12-29 DIAGNOSIS — Z780 Asymptomatic menopausal state: Secondary | ICD-10-CM | POA: Diagnosis not present

## 2020-12-29 DIAGNOSIS — M8589 Other specified disorders of bone density and structure, multiple sites: Secondary | ICD-10-CM | POA: Diagnosis not present

## 2020-12-29 DIAGNOSIS — M8580 Other specified disorders of bone density and structure, unspecified site: Secondary | ICD-10-CM

## 2021-03-14 DIAGNOSIS — H524 Presbyopia: Secondary | ICD-10-CM | POA: Diagnosis not present

## 2021-03-14 DIAGNOSIS — H401132 Primary open-angle glaucoma, bilateral, moderate stage: Secondary | ICD-10-CM | POA: Diagnosis not present

## 2021-03-14 DIAGNOSIS — H04123 Dry eye syndrome of bilateral lacrimal glands: Secondary | ICD-10-CM | POA: Diagnosis not present

## 2021-03-14 DIAGNOSIS — H43813 Vitreous degeneration, bilateral: Secondary | ICD-10-CM | POA: Diagnosis not present

## 2021-07-10 ENCOUNTER — Other Ambulatory Visit: Payer: Self-pay

## 2021-07-10 ENCOUNTER — Ambulatory Visit: Payer: Medicare Other | Admitting: Dermatology

## 2021-07-10 DIAGNOSIS — D2339 Other benign neoplasm of skin of other parts of face: Secondary | ICD-10-CM | POA: Diagnosis not present

## 2021-07-10 DIAGNOSIS — Z1283 Encounter for screening for malignant neoplasm of skin: Secondary | ICD-10-CM | POA: Diagnosis not present

## 2021-07-10 DIAGNOSIS — L821 Other seborrheic keratosis: Secondary | ICD-10-CM

## 2021-07-10 DIAGNOSIS — D1801 Hemangioma of skin and subcutaneous tissue: Secondary | ICD-10-CM | POA: Diagnosis not present

## 2021-07-10 DIAGNOSIS — L720 Epidermal cyst: Secondary | ICD-10-CM

## 2021-07-10 DIAGNOSIS — B001 Herpesviral vesicular dermatitis: Secondary | ICD-10-CM

## 2021-07-10 DIAGNOSIS — D239 Other benign neoplasm of skin, unspecified: Secondary | ICD-10-CM

## 2021-07-10 MED ORDER — VALACYCLOVIR HCL 500 MG PO TABS: 500.0000 mg | ORAL_TABLET | Freq: Two times a day (BID) | ORAL | 1 refills | Status: AC

## 2021-07-12 ENCOUNTER — Encounter: Payer: Self-pay | Admitting: Dermatology

## 2021-07-12 NOTE — Progress Notes (Signed)
° °  New Patient   Subjective  [name] [name] is a 76 y.o. female who presents for the following: Annual Exam (Pt here for annual and a refill for VALGANCICLOVIR 450MG ).  General skin examination, several issues to discuss Location:  Duration:  Quality:  Associated Signs/Symptoms: Modifying Factors:  Severity:  Timing: Context:    The following portions of the chart were reviewed this encounter and updated as appropriate:  Tobacco   Allergies   Meds   Problems   Med Hx   Surg Hx   Fam Hx       Objective  Well appearing patient in no apparent distress; mood and affect are within normal limits. General skin examination, no atypical pigmented lesions (no recurrent),.  No nonmelanoma skin cancer.  Multiple 1 mm smooth red dermal papules  Multiple tan flattopped textured 4 to 8 mm papules, typical dermoscopy  Right Inner Elbow Dermatofibroma vs fibrosis: 4 mm firm pink dermal papule  Right Malar Cheek Half millimeter firm upper dermal papule  Left Upper Vermilion Lip History of recurrent fever blisters, discussed treatment options.    A full examination was performed including scalp, head, eyes, ears, nose, lips, neck, chest, axillae, abdomen, back, buttocks, bilateral upper extremities, bilateral lower extremities, hands, feet, fingers, toes, fingernails, and toenails. All findings within normal limits unless otherwise noted below.  Areas beneath undergarments not fully examined   Assessment & Plan      I, Lavonna Monarch, MD, have reviewed all documentation for this visit.  The documentation on 07/12/21 for the exam, diagnosis, procedures, and orders are all accurate and complete.

## 2021-07-17 DIAGNOSIS — Z79899 Other long term (current) drug therapy: Secondary | ICD-10-CM | POA: Diagnosis not present

## 2021-07-17 DIAGNOSIS — E559 Vitamin D deficiency, unspecified: Secondary | ICD-10-CM | POA: Diagnosis not present

## 2021-07-17 DIAGNOSIS — Z0000 Encounter for general adult medical examination without abnormal findings: Secondary | ICD-10-CM | POA: Diagnosis not present

## 2021-07-25 DIAGNOSIS — E559 Vitamin D deficiency, unspecified: Secondary | ICD-10-CM | POA: Diagnosis not present

## 2021-07-25 DIAGNOSIS — M859 Disorder of bone density and structure, unspecified: Secondary | ICD-10-CM | POA: Diagnosis not present

## 2021-09-11 DIAGNOSIS — H401132 Primary open-angle glaucoma, bilateral, moderate stage: Secondary | ICD-10-CM | POA: Diagnosis not present

## 2021-12-27 ENCOUNTER — Other Ambulatory Visit: Payer: Self-pay | Admitting: Family Medicine

## 2021-12-27 DIAGNOSIS — Z1231 Encounter for screening mammogram for malignant neoplasm of breast: Secondary | ICD-10-CM

## 2022-01-09 ENCOUNTER — Ambulatory Visit: Payer: Medicare Other

## 2022-01-24 ENCOUNTER — Ambulatory Visit
Admission: RE | Admit: 2022-01-24 | Discharge: 2022-01-24 | Disposition: A | Discharge status: Home / Self Care | Payer: Medicare Other | Source: Ambulatory Visit | Attending: Family Medicine | Admitting: Family Medicine

## 2022-01-24 DIAGNOSIS — Z1231 Encounter for screening mammogram for malignant neoplasm of breast: Secondary | ICD-10-CM | POA: Diagnosis not present

## 2022-03-13 DIAGNOSIS — H04123 Dry eye syndrome of bilateral lacrimal glands: Secondary | ICD-10-CM | POA: Diagnosis not present

## 2022-03-13 DIAGNOSIS — H524 Presbyopia: Secondary | ICD-10-CM | POA: Diagnosis not present

## 2022-03-13 DIAGNOSIS — H401132 Primary open-angle glaucoma, bilateral, moderate stage: Secondary | ICD-10-CM | POA: Diagnosis not present

## 2022-03-13 DIAGNOSIS — H43813 Vitreous degeneration, bilateral: Secondary | ICD-10-CM | POA: Diagnosis not present

## 2022-03-13 DIAGNOSIS — H52203 Unspecified astigmatism, bilateral: Secondary | ICD-10-CM | POA: Diagnosis not present

## 2022-07-10 ENCOUNTER — Ambulatory Visit: Payer: Medicare Other | Admitting: Dermatology

## 2022-07-19 DIAGNOSIS — Z0000 Encounter for general adult medical examination without abnormal findings: Secondary | ICD-10-CM | POA: Diagnosis not present

## 2022-07-31 DIAGNOSIS — Z23 Encounter for immunization: Secondary | ICD-10-CM | POA: Diagnosis not present

## 2022-07-31 DIAGNOSIS — E559 Vitamin D deficiency, unspecified: Secondary | ICD-10-CM | POA: Diagnosis not present

## 2022-07-31 DIAGNOSIS — E785 Hyperlipidemia, unspecified: Secondary | ICD-10-CM | POA: Diagnosis not present

## 2022-07-31 DIAGNOSIS — H401190 Primary open-angle glaucoma, unspecified eye, stage unspecified: Secondary | ICD-10-CM | POA: Diagnosis not present

## 2022-07-31 DIAGNOSIS — M810 Age-related osteoporosis without current pathological fracture: Secondary | ICD-10-CM | POA: Diagnosis not present

## 2022-09-11 DIAGNOSIS — H401132 Primary open-angle glaucoma, bilateral, moderate stage: Secondary | ICD-10-CM | POA: Diagnosis not present

## 2022-11-08 IMAGING — MG MM DIGITAL SCREENING BILAT W/ TOMO AND CAD
8 series · 8 of 24 positions shown · non-contrast
Comparison: Previous exam(s).

CLINICAL DATA: Screening.

EXAM:
DIGITAL SCREENING BILATERAL MAMMOGRAM WITH TOMOSYNTHESIS AND CAD
TECHNIQUE: Bilateral screening digital craniocaudal and mediolateral oblique
mammograms were obtained. Bilateral screening digital breast
tomosynthesis was performed. The images were evaluated with
computer-aided detection.

[R MLO synth-2D]
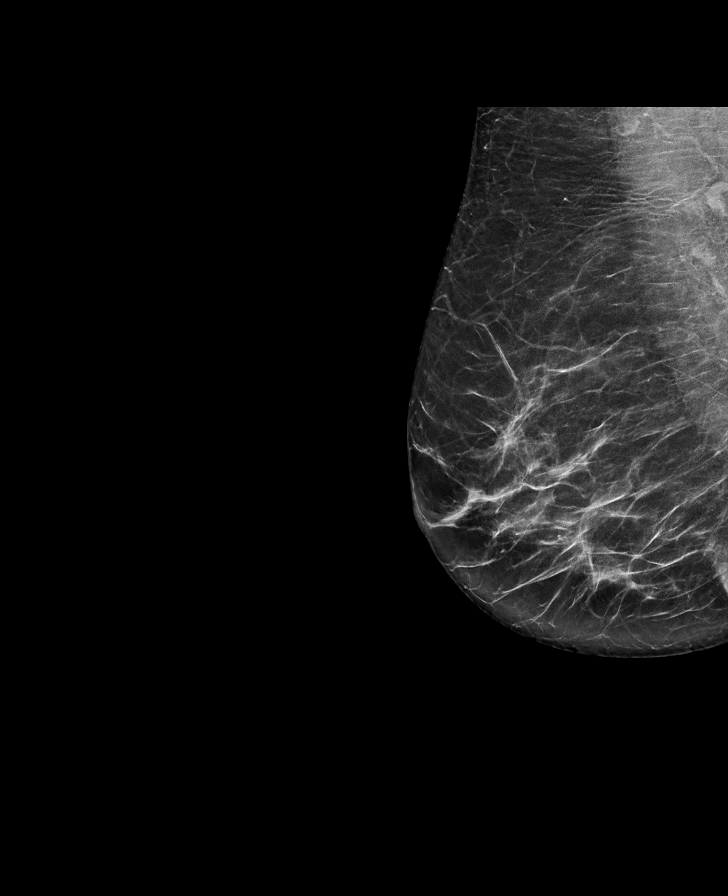

[L MLO synth-2D]
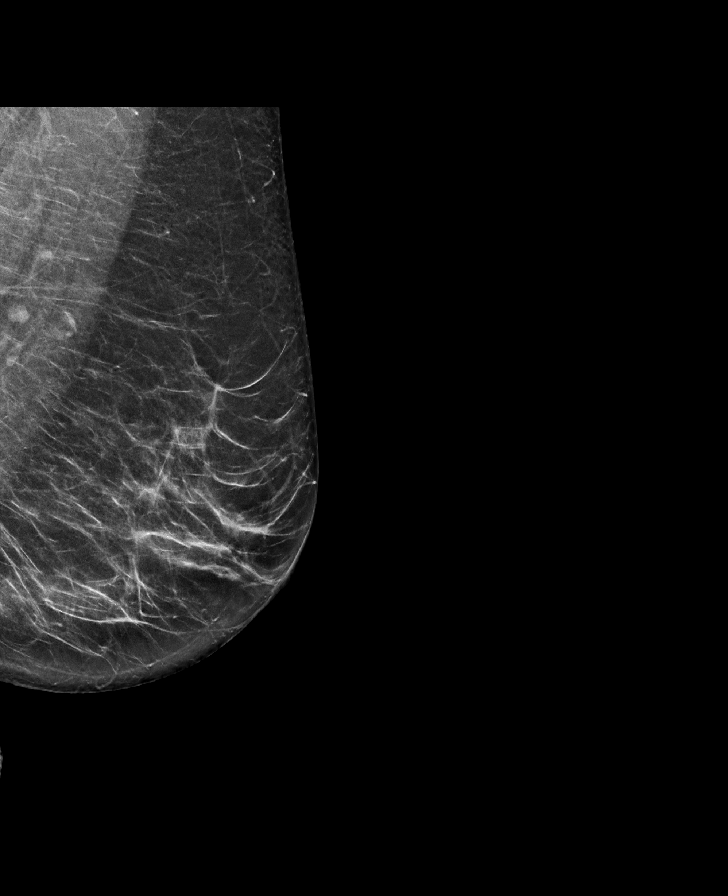

[L CC synth-2D]
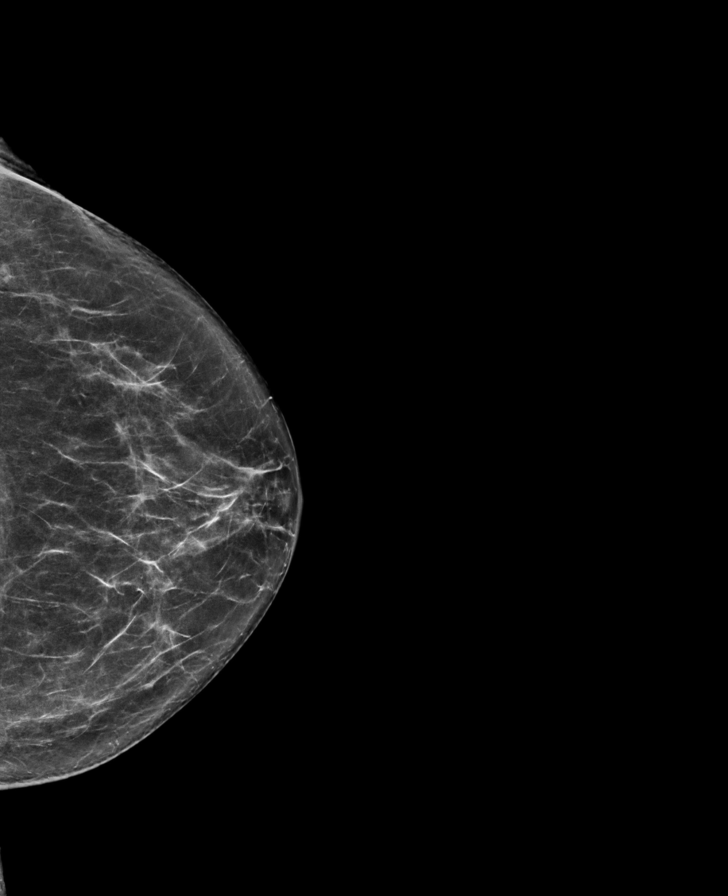

[R CC synth-2D]
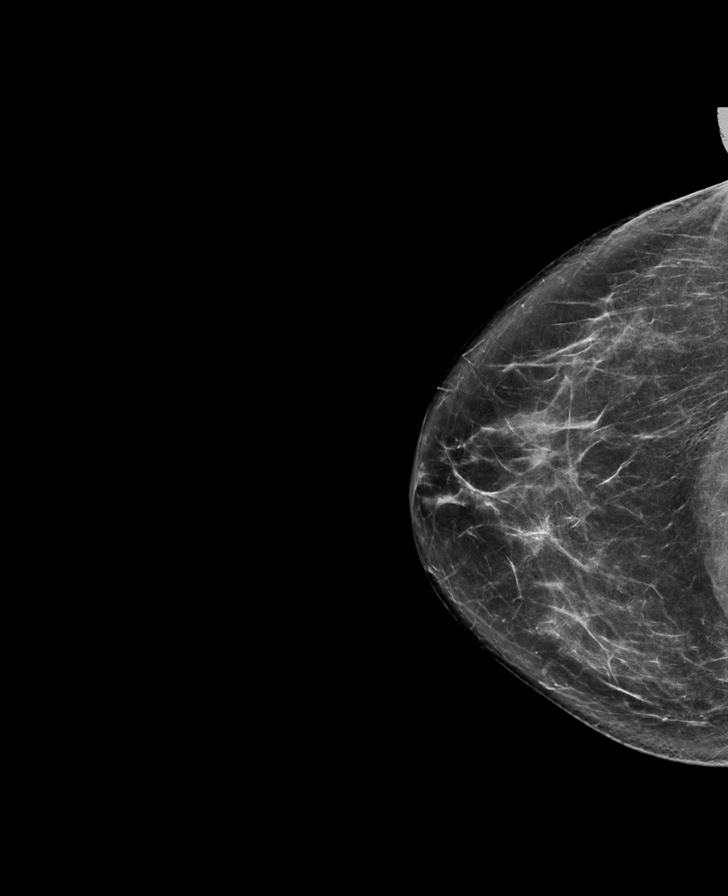

[R CC tomo · tomo slice 37/73.0]
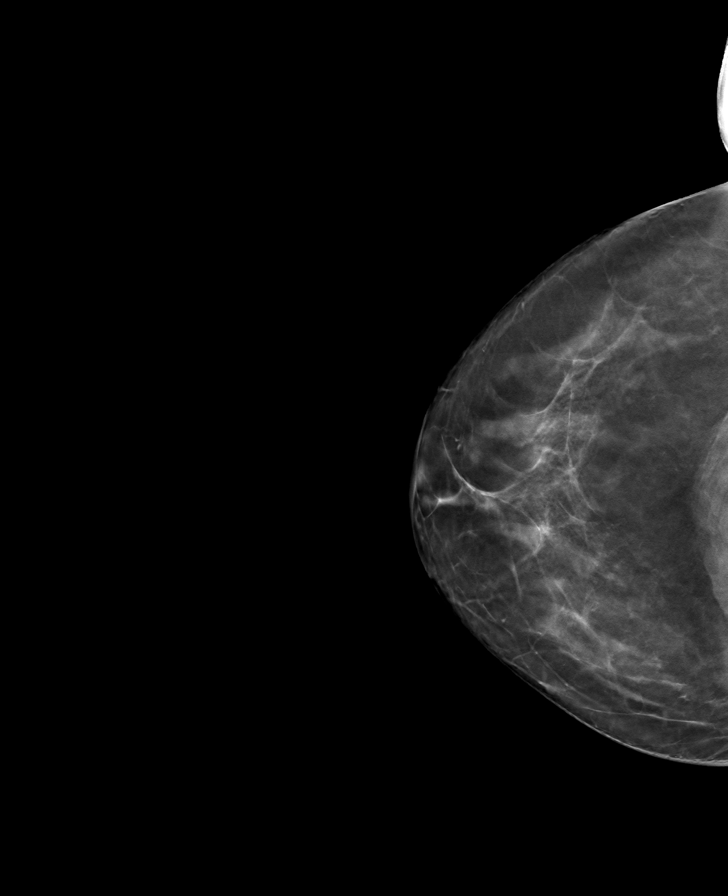

[L MLO tomo · tomo slice 36/71.0]
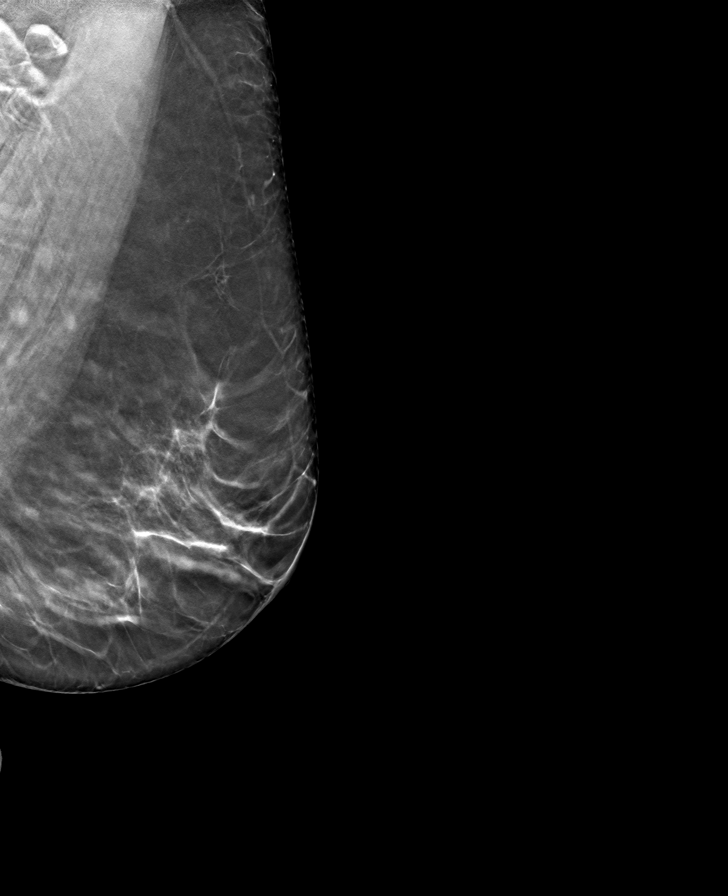

[R MLO tomo · tomo slice 37/74.0]
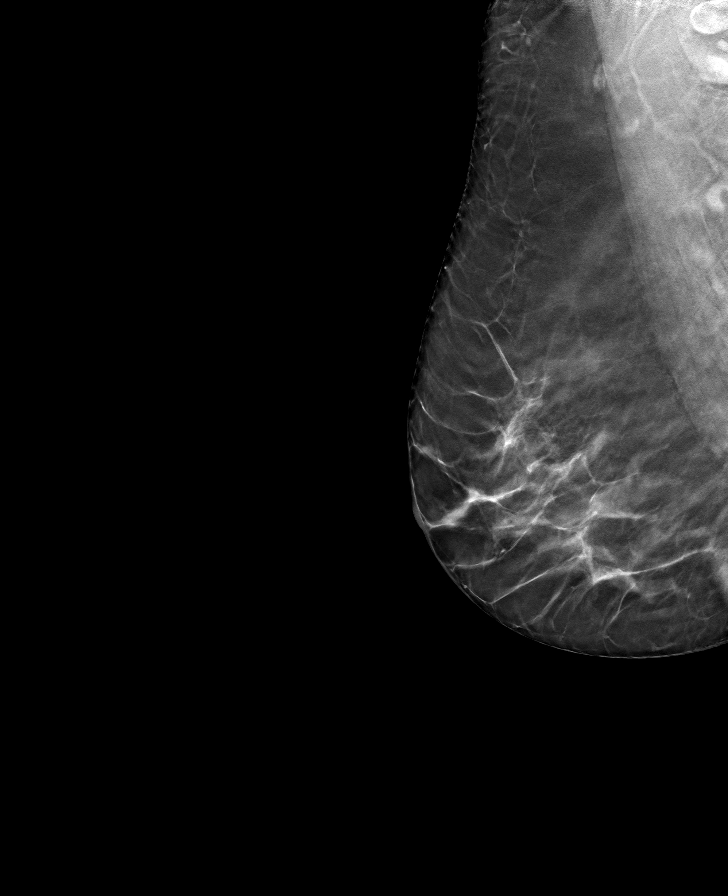

[L CC tomo · tomo slice 35/69.0]
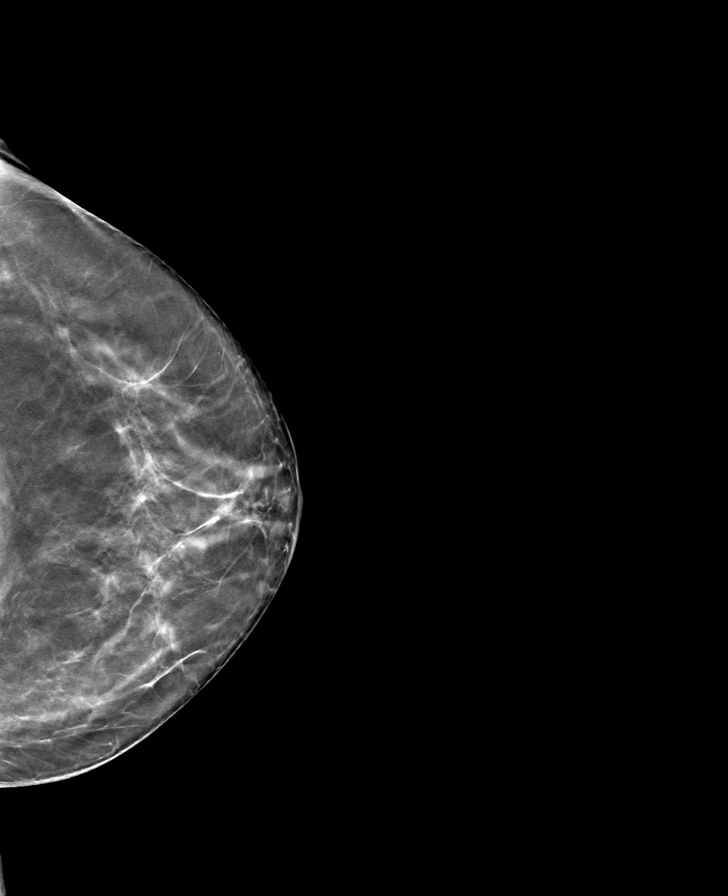

[8 of 24 positions shown; findings below may reference images not displayed]

ACR Breast Density Category b: There are scattered areas of
fibroglandular density.
FINDINGS: There are no findings suspicious for malignancy. The images were
evaluated with computer-aided detection.
IMPRESSION: No mammographic evidence of malignancy. A result letter of this
screening mammogram will be mailed directly to the patient.

RECOMMENDATION:
Screening mammogram in one year. (Code:WJ-I-BG6)

BI-RADS CATEGORY  1: Negative.

## 2023-01-31 DIAGNOSIS — E559 Vitamin D deficiency, unspecified: Secondary | ICD-10-CM | POA: Diagnosis not present

## 2023-01-31 DIAGNOSIS — M859 Disorder of bone density and structure, unspecified: Secondary | ICD-10-CM | POA: Diagnosis not present

## 2023-01-31 DIAGNOSIS — H401190 Primary open-angle glaucoma, unspecified eye, stage unspecified: Secondary | ICD-10-CM | POA: Diagnosis not present

## 2023-01-31 DIAGNOSIS — Z23 Encounter for immunization: Secondary | ICD-10-CM | POA: Diagnosis not present

## 2023-02-03 ENCOUNTER — Other Ambulatory Visit: Payer: Self-pay | Admitting: Family Medicine

## 2023-02-03 DIAGNOSIS — M8580 Other specified disorders of bone density and structure, unspecified site: Secondary | ICD-10-CM

## 2023-02-03 DIAGNOSIS — Z1231 Encounter for screening mammogram for malignant neoplasm of breast: Secondary | ICD-10-CM

## 2023-02-03 DIAGNOSIS — M859 Disorder of bone density and structure, unspecified: Secondary | ICD-10-CM

## 2023-02-05 ENCOUNTER — Ambulatory Visit
Admission: RE | Admit: 2023-02-05 | Discharge: 2023-02-05 | Disposition: A | Discharge status: Home / Self Care | Payer: Medicare Other | Source: Ambulatory Visit | Attending: Family Medicine | Admitting: Family Medicine

## 2023-02-05 DIAGNOSIS — Z1231 Encounter for screening mammogram for malignant neoplasm of breast: Secondary | ICD-10-CM | POA: Diagnosis not present

## 2023-03-18 DIAGNOSIS — H401132 Primary open-angle glaucoma, bilateral, moderate stage: Secondary | ICD-10-CM | POA: Diagnosis not present

## 2023-03-18 DIAGNOSIS — H5213 Myopia, bilateral: Secondary | ICD-10-CM | POA: Diagnosis not present

## 2023-03-18 DIAGNOSIS — H04123 Dry eye syndrome of bilateral lacrimal glands: Secondary | ICD-10-CM | POA: Diagnosis not present

## 2023-03-18 DIAGNOSIS — H0100A Unspecified blepharitis right eye, upper and lower eyelids: Secondary | ICD-10-CM | POA: Diagnosis not present

## 2023-03-18 DIAGNOSIS — H43813 Vitreous degeneration, bilateral: Secondary | ICD-10-CM | POA: Diagnosis not present

## 2023-03-18 DIAGNOSIS — H0100B Unspecified blepharitis left eye, upper and lower eyelids: Secondary | ICD-10-CM | POA: Diagnosis not present

## 2023-03-18 DIAGNOSIS — H524 Presbyopia: Secondary | ICD-10-CM | POA: Diagnosis not present

## 2023-07-21 DIAGNOSIS — Z0000 Encounter for general adult medical examination without abnormal findings: Secondary | ICD-10-CM | POA: Diagnosis not present

## 2023-07-21 DIAGNOSIS — Z23 Encounter for immunization: Secondary | ICD-10-CM | POA: Diagnosis not present

## 2023-07-21 DIAGNOSIS — Z1389 Encounter for screening for other disorder: Secondary | ICD-10-CM | POA: Diagnosis not present

## 2023-07-22 DIAGNOSIS — L570 Actinic keratosis: Secondary | ICD-10-CM | POA: Diagnosis not present

## 2023-07-22 DIAGNOSIS — Z85820 Personal history of malignant melanoma of skin: Secondary | ICD-10-CM | POA: Diagnosis not present

## 2023-07-22 DIAGNOSIS — L821 Other seborrheic keratosis: Secondary | ICD-10-CM | POA: Diagnosis not present

## 2023-07-22 DIAGNOSIS — D1801 Hemangioma of skin and subcutaneous tissue: Secondary | ICD-10-CM | POA: Diagnosis not present

## 2023-07-22 DIAGNOSIS — D225 Melanocytic nevi of trunk: Secondary | ICD-10-CM | POA: Diagnosis not present

## 2023-07-22 DIAGNOSIS — D485 Neoplasm of uncertain behavior of skin: Secondary | ICD-10-CM | POA: Diagnosis not present

## 2023-08-05 DIAGNOSIS — E559 Vitamin D deficiency, unspecified: Secondary | ICD-10-CM | POA: Diagnosis not present

## 2023-08-05 DIAGNOSIS — H401190 Primary open-angle glaucoma, unspecified eye, stage unspecified: Secondary | ICD-10-CM | POA: Diagnosis not present

## 2023-08-05 DIAGNOSIS — M859 Disorder of bone density and structure, unspecified: Secondary | ICD-10-CM | POA: Diagnosis not present

## 2023-08-08 ENCOUNTER — Other Ambulatory Visit: Payer: Self-pay | Admitting: Family Medicine

## 2023-08-08 DIAGNOSIS — M8580 Other specified disorders of bone density and structure, unspecified site: Secondary | ICD-10-CM

## 2023-08-08 DIAGNOSIS — M859 Disorder of bone density and structure, unspecified: Secondary | ICD-10-CM

## 2023-08-19 ENCOUNTER — Ambulatory Visit
Admission: RE | Admit: 2023-08-19 | Discharge: 2023-08-19 | Disposition: A | Discharge status: Home / Self Care | Payer: Medicare Other | Source: Ambulatory Visit | Attending: Family Medicine | Admitting: Family Medicine

## 2023-08-19 DIAGNOSIS — M8588 Other specified disorders of bone density and structure, other site: Secondary | ICD-10-CM | POA: Diagnosis not present

## 2023-08-19 DIAGNOSIS — M8580 Other specified disorders of bone density and structure, unspecified site: Secondary | ICD-10-CM

## 2023-08-19 DIAGNOSIS — M859 Disorder of bone density and structure, unspecified: Secondary | ICD-10-CM

## 2023-08-27 DIAGNOSIS — H04123 Dry eye syndrome of bilateral lacrimal glands: Secondary | ICD-10-CM | POA: Diagnosis not present

## 2023-08-27 DIAGNOSIS — H401132 Primary open-angle glaucoma, bilateral, moderate stage: Secondary | ICD-10-CM | POA: Diagnosis not present

## 2024-02-04 ENCOUNTER — Other Ambulatory Visit: Payer: Self-pay | Admitting: Family Medicine

## 2024-02-04 DIAGNOSIS — Z1231 Encounter for screening mammogram for malignant neoplasm of breast: Secondary | ICD-10-CM

## 2024-02-12 ENCOUNTER — Ambulatory Visit
Admission: RE | Admit: 2024-02-12 | Discharge: 2024-02-12 | Disposition: A | Discharge status: Home / Self Care | Source: Ambulatory Visit | Attending: Family Medicine | Admitting: Family Medicine

## 2024-02-12 DIAGNOSIS — Z1231 Encounter for screening mammogram for malignant neoplasm of breast: Secondary | ICD-10-CM | POA: Diagnosis not present

## 2024-03-05 DIAGNOSIS — H0100A Unspecified blepharitis right eye, upper and lower eyelids: Secondary | ICD-10-CM | POA: Diagnosis not present

## 2024-03-05 DIAGNOSIS — Z961 Presence of intraocular lens: Secondary | ICD-10-CM | POA: Diagnosis not present

## 2024-03-05 DIAGNOSIS — H0100B Unspecified blepharitis left eye, upper and lower eyelids: Secondary | ICD-10-CM | POA: Diagnosis not present

## 2024-03-05 DIAGNOSIS — H401132 Primary open-angle glaucoma, bilateral, moderate stage: Secondary | ICD-10-CM | POA: Diagnosis not present

## 2024-03-05 DIAGNOSIS — H524 Presbyopia: Secondary | ICD-10-CM | POA: Diagnosis not present

## 2024-03-05 DIAGNOSIS — H5213 Myopia, bilateral: Secondary | ICD-10-CM | POA: Diagnosis not present
